# Patient Record
Sex: Male | Born: 1970 | Race: White | Hispanic: No | Marital: Single
Health system: Southern US, Community
[De-identification: ages and names within clinical notes are randomized; demographics above are authoritative.]

---

## 2002-12-17 ENCOUNTER — Emergency Department (HOSPITAL_COMMUNITY): Admission: EM | Admit: 2002-12-17 | Discharge: 2002-12-17 | Payer: Self-pay | Admitting: Emergency Medicine

## 2003-04-21 ENCOUNTER — Emergency Department (HOSPITAL_COMMUNITY): Admission: EM | Admit: 2003-04-21 | Discharge: 2003-04-22 | Payer: Self-pay | Admitting: Emergency Medicine

## 2009-06-09 ENCOUNTER — Emergency Department (HOSPITAL_BASED_OUTPATIENT_CLINIC_OR_DEPARTMENT_OTHER): Admission: EM | Admit: 2009-06-09 | Discharge: 2009-06-09 | Payer: Self-pay | Admitting: Emergency Medicine

## 2009-06-09 ENCOUNTER — Ambulatory Visit: Payer: Self-pay | Admitting: Diagnostic Radiology

## 2010-05-27 LAB — BASIC METABOLIC PANEL
BUN: 19 mg/dL (ref 6–23)
Chloride: 103 mEq/L (ref 96–112)
GFR calc non Af Amer: >60 mL/min (ref >60)
Glucose, Bld: 93 mg/dL (ref 70–99)

## 2010-05-27 LAB — DIFFERENTIAL
Basophils Absolute: 0.1 10*3/uL (ref 0.0–0.1)
Basophils Relative: 1 % (ref 0–1)
Eosinophils Absolute: 0.2 10*3/uL (ref 0.0–0.7)
Eosinophils Relative: 2 % (ref 0–5)
Lymphocytes Relative: 24 % (ref 12–46)
Lymphs Abs: 2.6 10*3/uL (ref 0.7–4.0)
Monocytes Absolute: 0.8 10*3/uL (ref 0.1–1.0)

## 2010-05-27 LAB — CBC
Hemoglobin: 15 g/dL (ref 13.0–17.0)
MCHC: 33.6 g/dL (ref 30.0–36.0)
WBC: 10.8 10*3/uL — ABNORMAL HIGH (ref 4.0–10.5)

## 2010-05-27 LAB — URINALYSIS, ROUTINE W REFLEX MICROSCOPIC
Glucose, UA: NEGATIVE mg/dL
Ketones, ur: NEGATIVE mg/dL
Urobilinogen, UA: 0.2 mg/dL (ref 0.0–1.0)

## 2012-09-12 ENCOUNTER — Other Ambulatory Visit (HOSPITAL_BASED_OUTPATIENT_CLINIC_OR_DEPARTMENT_OTHER): Payer: Self-pay | Admitting: Family Medicine

## 2012-09-12 DIAGNOSIS — M542 Cervicalgia: Secondary | ICD-10-CM

## 2012-09-12 DIAGNOSIS — M5412 Radiculopathy, cervical region: Secondary | ICD-10-CM

## 2012-09-16 ENCOUNTER — Ambulatory Visit (HOSPITAL_BASED_OUTPATIENT_CLINIC_OR_DEPARTMENT_OTHER)
Admission: RE | Admit: 2012-09-16 | Discharge: 2012-09-16 | Disposition: A | Discharge status: Home / Self Care | Payer: Managed Care, Other (non HMO) | Source: Ambulatory Visit | Attending: Family Medicine | Admitting: Family Medicine

## 2012-09-16 ENCOUNTER — Ambulatory Visit (HOSPITAL_BASED_OUTPATIENT_CLINIC_OR_DEPARTMENT_OTHER): Payer: Managed Care, Other (non HMO)

## 2012-09-16 DIAGNOSIS — M5412 Radiculopathy, cervical region: Secondary | ICD-10-CM

## 2012-09-16 DIAGNOSIS — M5020 Other cervical disc displacement, unspecified cervical region: Secondary | ICD-10-CM | POA: Insufficient documentation

## 2012-09-16 DIAGNOSIS — Q7649 Other congenital malformations of spine, not associated with scoliosis: Secondary | ICD-10-CM | POA: Insufficient documentation

## 2012-09-16 DIAGNOSIS — M542 Cervicalgia: Secondary | ICD-10-CM | POA: Insufficient documentation

## 2013-07-03 ENCOUNTER — Other Ambulatory Visit: Payer: Self-pay | Admitting: Family Medicine

## 2013-07-03 DIAGNOSIS — R0789 Other chest pain: Secondary | ICD-10-CM

## 2013-07-05 ENCOUNTER — Ambulatory Visit
Admission: RE | Admit: 2013-07-05 | Discharge: 2013-07-05 | Disposition: A | Discharge status: Home / Self Care | Payer: Managed Care, Other (non HMO) | Source: Ambulatory Visit | Attending: Family Medicine | Admitting: Family Medicine

## 2013-07-05 DIAGNOSIS — R0789 Other chest pain: Secondary | ICD-10-CM

## 2013-07-05 MED ORDER — IOHEXOL 300 MG/ML  SOLN
75.0000 mL | Freq: Once | INTRAMUSCULAR | Status: AC | PRN
  Administered 2013-07-05: 75 mL via INTRAVENOUS

## 2016-01-28 IMAGING — CT CT CHEST W/ CM
2 of 3 series · 15 of 36 positions shown, 18 images · IV contrast (75CC OMNI 300)
Comparison: CT chest scan of 06/09/2009

CLINICAL DATA: Chest pain in the left upper chest for 1 month

EXAM:
CT CHEST WITH CONTRAST
TECHNIQUE: Multidetector CT imaging of the chest was performed during
intravenous contrast administration.
CONTRAST:  75mL OMNIPAQUE IOHEXOL 300 MG/ML  SOLN

[Series 2: chest with · axial · 0.78mm/px · z∈[-267,-22]mm · 12 of 59 slices shown, 15 images]
[im 5/59  mediastinal]
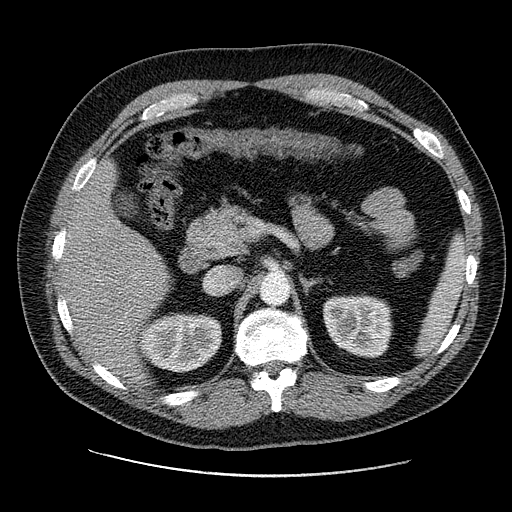
[im 5/59  lung]
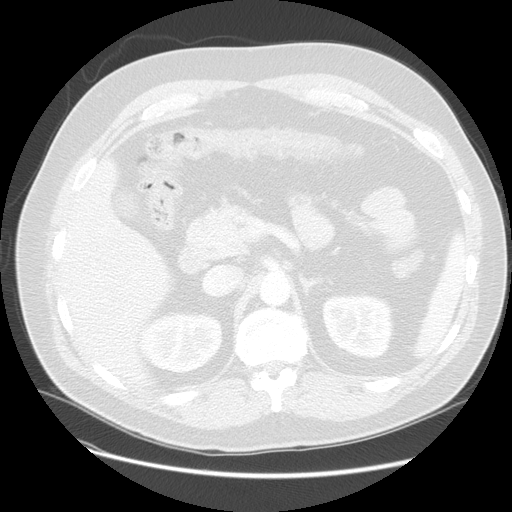
[im 9/59  lung]
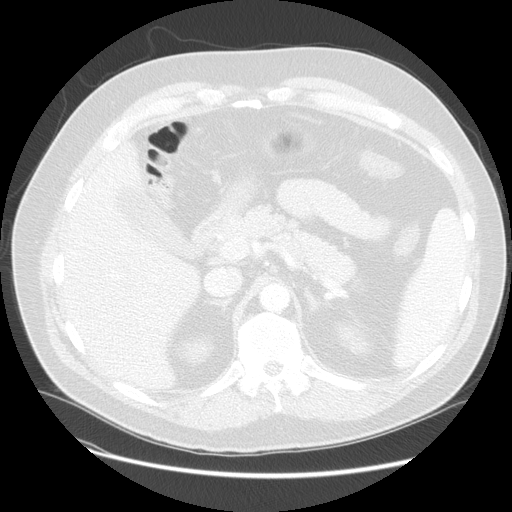
[im 13/59  lung]
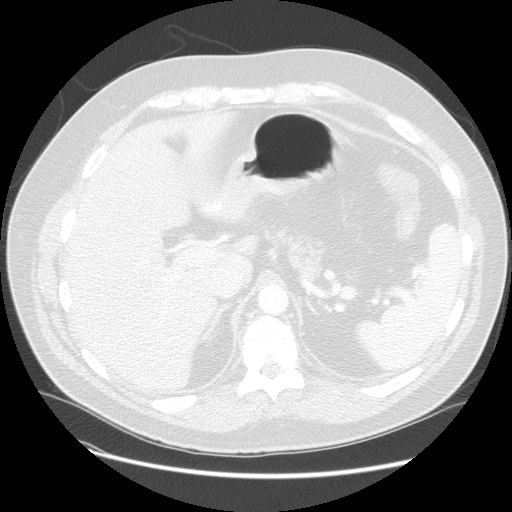
[im 18/59  lung]
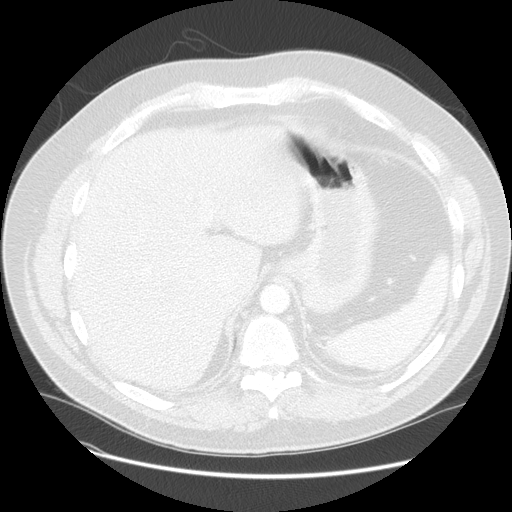
[im 22/59  mediastinal]
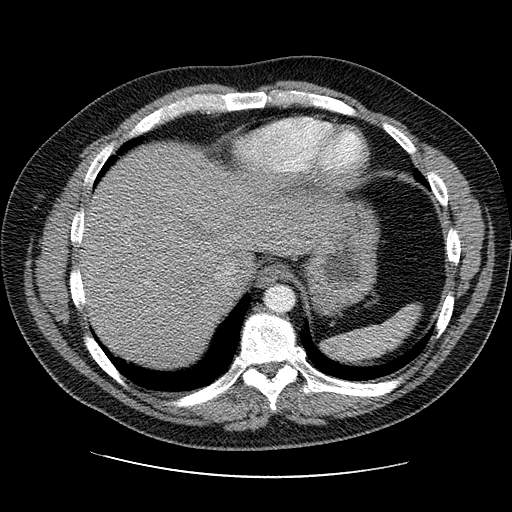
[im 22/59  lung]
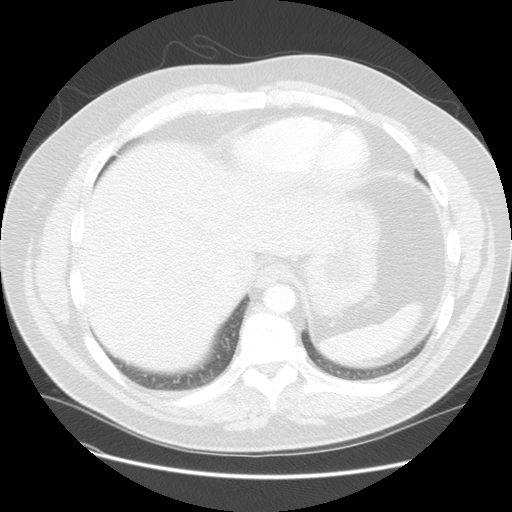
[im 26/59  lung]
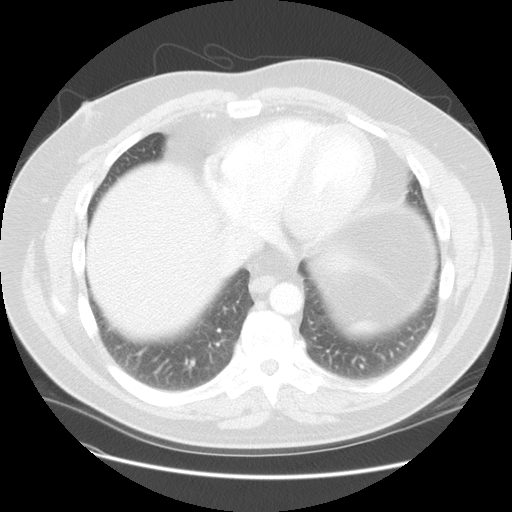
[im 33/59  lung]
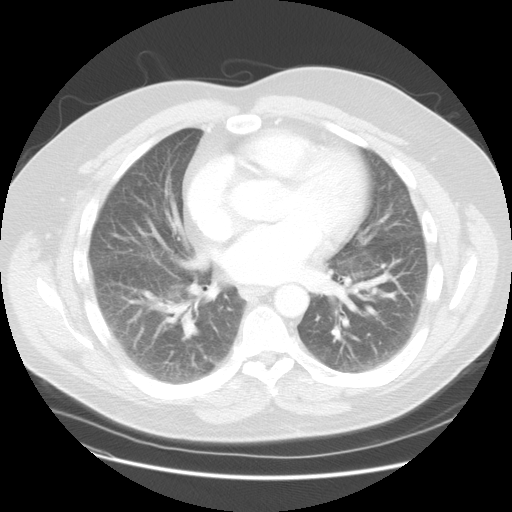
[im 37/59  lung]
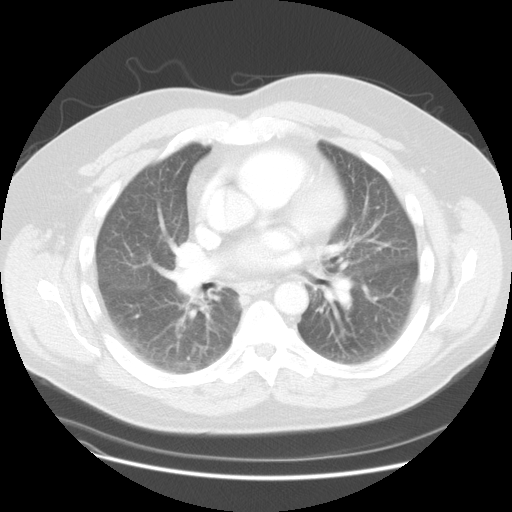
[im 41/59  mediastinal]
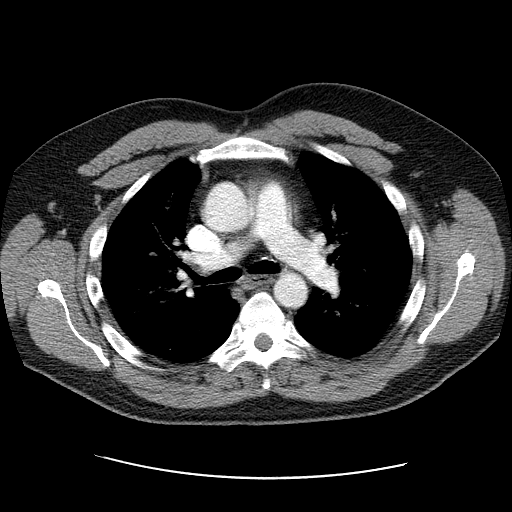
[im 41/59  lung]
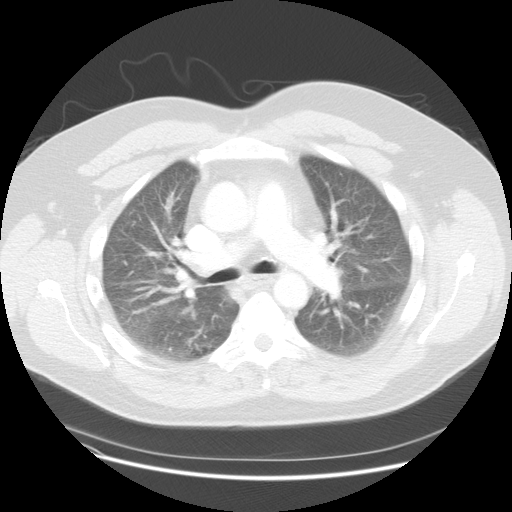
[im 46/59  lung]
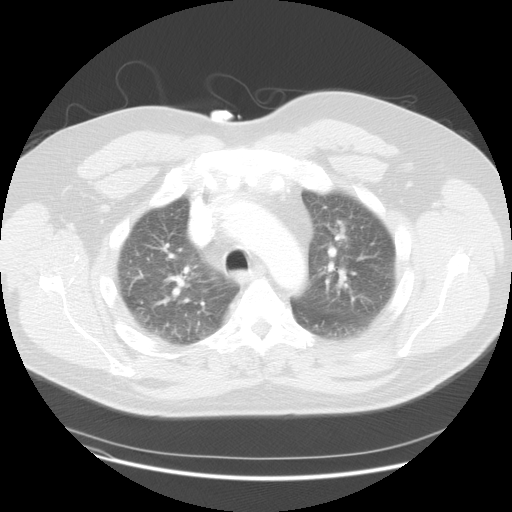
[im 50/59  lung]
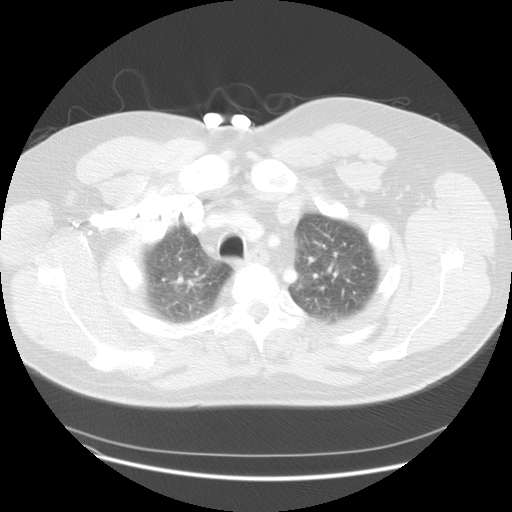
[im 54/59  lung]
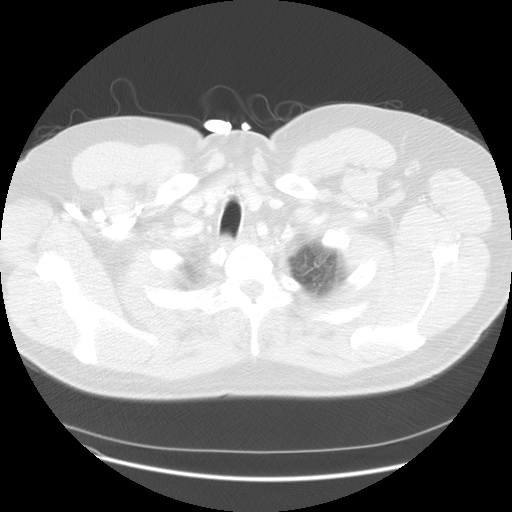

[Series 400: cor · coronal · 0.78mm/px · 3 of 157 slices shown]
[im 32/157  lung]
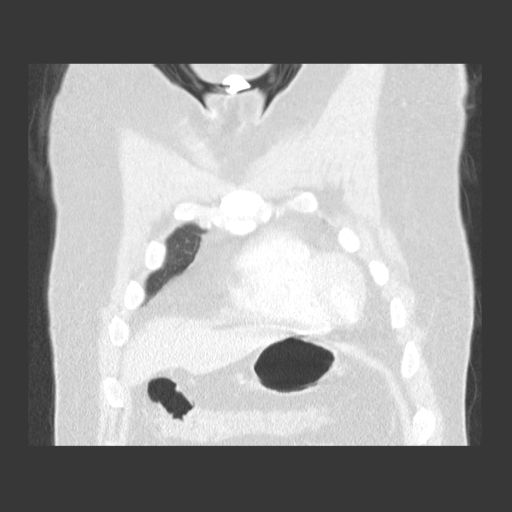
[im 63/157  lung]
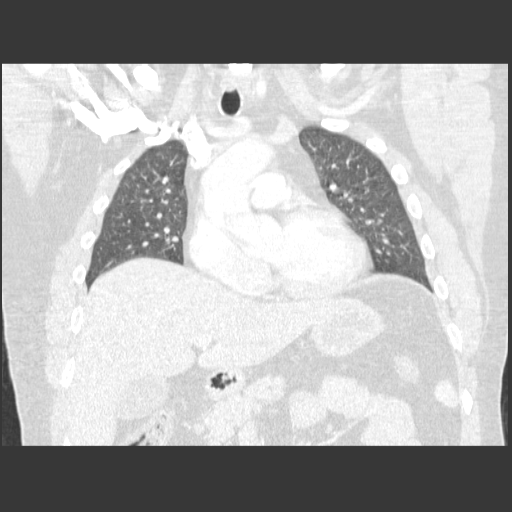
[im 94/157  lung]
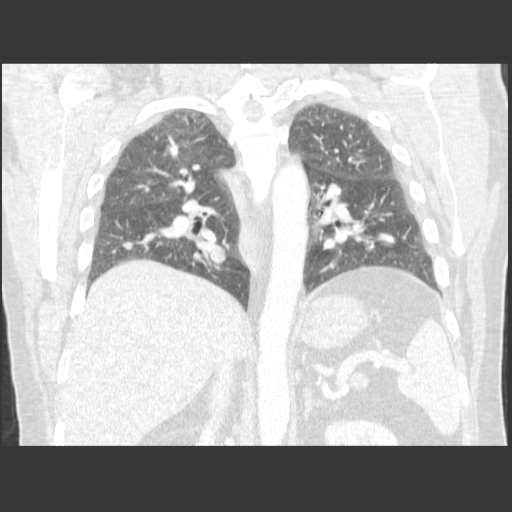

[15 of 36 positions shown; findings below may reference images not displayed]

FINDINGS: On lung window images, no lung infiltrate or nodule is seen. No
pleural effusion is noted. The central airway is patent. A small
bleb is noted peripherally in the posterior right lower lobe. On
bone window images the thoracic vertebrae are in normal alignment.
No rib abnormality is evident.

On soft tissue window images, the thyroid gland is unremarkable. The
thoracic aorta opacifies, and the origins of the great vessels are
patent. The pulmonary arteries opacify with no significant
abnormality noted. No mediastinal or hilar adenopathy is seen. The
upper abdomen is unremarkable with small splenules noted medially
and posteriorly in the left upper quadrant.
IMPRESSION: Negative CT of the chest. No lung infiltrate or nodule is seen and
no adenopathy is noted. No bony abnormality is noted.

## 2018-02-28 DIAGNOSIS — J069 Acute upper respiratory infection, unspecified: Secondary | ICD-10-CM | POA: Diagnosis not present

## 2018-06-11 DIAGNOSIS — L03211 Cellulitis of face: Secondary | ICD-10-CM | POA: Diagnosis not present

## 2018-08-16 DIAGNOSIS — R42 Dizziness and giddiness: Secondary | ICD-10-CM | POA: Diagnosis not present

## 2018-08-22 ENCOUNTER — Encounter (HOSPITAL_COMMUNITY): Payer: Self-pay | Admitting: Physician Assistant

## 2018-08-22 ENCOUNTER — Other Ambulatory Visit (HOSPITAL_COMMUNITY): Payer: Self-pay | Admitting: Physician Assistant

## 2018-08-22 DIAGNOSIS — R42 Dizziness and giddiness: Secondary | ICD-10-CM

## 2018-09-14 ENCOUNTER — Other Ambulatory Visit: Payer: Self-pay

## 2018-09-14 ENCOUNTER — Ambulatory Visit (HOSPITAL_COMMUNITY): Payer: BC Managed Care – PPO | Attending: Cardiovascular Disease

## 2018-09-14 ENCOUNTER — Encounter (INDEPENDENT_AMBULATORY_CARE_PROVIDER_SITE_OTHER): Payer: Self-pay

## 2018-09-14 DIAGNOSIS — R42 Dizziness and giddiness: Secondary | ICD-10-CM | POA: Diagnosis not present

## 2019-01-18 DIAGNOSIS — Z20828 Contact with and (suspected) exposure to other viral communicable diseases: Secondary | ICD-10-CM | POA: Diagnosis not present

## 2019-01-18 DIAGNOSIS — J069 Acute upper respiratory infection, unspecified: Secondary | ICD-10-CM | POA: Diagnosis not present

## 2019-01-19 DIAGNOSIS — R222 Localized swelling, mass and lump, trunk: Secondary | ICD-10-CM | POA: Diagnosis not present

## 2019-01-19 DIAGNOSIS — J029 Acute pharyngitis, unspecified: Secondary | ICD-10-CM | POA: Diagnosis not present

## 2019-06-12 DIAGNOSIS — D125 Benign neoplasm of sigmoid colon: Secondary | ICD-10-CM | POA: Diagnosis not present

## 2019-06-12 DIAGNOSIS — K648 Other hemorrhoids: Secondary | ICD-10-CM | POA: Diagnosis not present

## 2019-06-12 DIAGNOSIS — K621 Rectal polyp: Secondary | ICD-10-CM | POA: Diagnosis not present

## 2019-06-12 DIAGNOSIS — K635 Polyp of colon: Secondary | ICD-10-CM | POA: Diagnosis not present

## 2019-06-12 DIAGNOSIS — K6389 Other specified diseases of intestine: Secondary | ICD-10-CM | POA: Diagnosis not present

## 2019-06-12 DIAGNOSIS — Z1211 Encounter for screening for malignant neoplasm of colon: Secondary | ICD-10-CM | POA: Diagnosis not present

## 2020-12-26 DIAGNOSIS — M25521 Pain in right elbow: Secondary | ICD-10-CM | POA: Diagnosis not present

## 2020-12-26 DIAGNOSIS — M654 Radial styloid tenosynovitis [de Quervain]: Secondary | ICD-10-CM | POA: Diagnosis not present

## 2021-02-22 DIAGNOSIS — H109 Unspecified conjunctivitis: Secondary | ICD-10-CM | POA: Diagnosis not present

## 2021-02-27 DIAGNOSIS — H20012 Primary iridocyclitis, left eye: Secondary | ICD-10-CM | POA: Diagnosis not present

## 2021-03-03 DIAGNOSIS — H20012 Primary iridocyclitis, left eye: Secondary | ICD-10-CM | POA: Diagnosis not present

## 2021-04-08 DIAGNOSIS — L02818 Cutaneous abscess of other sites: Secondary | ICD-10-CM | POA: Diagnosis not present

## 2021-04-10 DIAGNOSIS — L02214 Cutaneous abscess of groin: Secondary | ICD-10-CM | POA: Diagnosis not present

## 2021-04-21 DIAGNOSIS — U071 COVID-19: Secondary | ICD-10-CM | POA: Diagnosis not present

## 2021-05-05 ENCOUNTER — Emergency Department (HOSPITAL_COMMUNITY): Payer: BC Managed Care – PPO

## 2021-05-05 ENCOUNTER — Emergency Department (HOSPITAL_COMMUNITY)
Admission: EM | Admit: 2021-05-05 | Discharge: 2021-05-05 | Disposition: A | Discharge status: Home / Self Care | Payer: BC Managed Care – PPO | Attending: Emergency Medicine | Admitting: Emergency Medicine

## 2021-05-05 ENCOUNTER — Other Ambulatory Visit: Payer: Self-pay

## 2021-05-05 DIAGNOSIS — N50812 Left testicular pain: Secondary | ICD-10-CM | POA: Diagnosis not present

## 2021-05-05 DIAGNOSIS — Z87891 Personal history of nicotine dependence: Secondary | ICD-10-CM | POA: Diagnosis not present

## 2021-05-05 DIAGNOSIS — N503 Cyst of epididymis: Secondary | ICD-10-CM | POA: Diagnosis not present

## 2021-05-05 MED ORDER — OXYCODONE HCL 5 MG PO TABS
5.0000 mg | ORAL_TABLET | Freq: Once | ORAL | Status: AC
  Administered 2021-05-05: 5 mg via ORAL
  Filled 2021-05-05: qty 1

## 2021-05-05 MED ORDER — ACETAMINOPHEN 500 MG PO TABS
1000.0000 mg | ORAL_TABLET | Freq: Once | ORAL | Status: AC
  Administered 2021-05-05: 1000 mg via ORAL
  Filled 2021-05-05: qty 2

## 2021-05-05 NOTE — Discharge Instructions (Signed)
Wear tight fitting underwear.  Follow-up with urologist in the office.  Try not to do any heavy lifting squatting or bending.  Take 4 over the counter ibuprofen tablets 3 times a day or 2 over-the-counter naproxen tablets twice a day for pain. Also take tylenol 1000mg (2 extra strength) four times a day.

## 2021-05-05 NOTE — ED Provider Notes (Signed)
[facility]-EMERGENCY DEPT Provider Note   CSN: [id number] Arrival date & time: 05/05/21  1949     History  Chief Complaint  Patient presents with   Testicle Pain    Dustin Craig is a 51 y.o. male.  51 yo M with a chief complaint of left testicular pain.  This started very shortly after having a sexual encounter with his wife.  He was standing to check on some meat he was smoking and started having progressive worsening pain to the testicle.  He felt [place] felt like he had an extra lump there and so came to the ED for evaluation.  Denies history of hernias.  Denies trauma to the area.   Testicle Pain      Home Medications Prior to Admission medications   Not on File      Allergies    Patient has no allergy information on record.    Review of Systems   Review of Systems  Genitourinary:  Positive for testicular pain.   Physical Exam Updated Vital Signs BP 127/74    Pulse 65    Temp 97.8 F (36.6 C) (Oral)    Resp 18    Ht 6\' 5"  (1.956 m)    Wt 122.5 kg    SpO2 95%    BMI 32.02 kg/m  Physical Exam Vitals and nursing note reviewed.  Constitutional:      Appearance: He is well-developed.  HENT:     Head: Normocephalic and atraumatic.  Eyes:     Pupils: Pupils are equal, round, and reactive to light.  Neck:     Vascular: No JVD.  Cardiovascular:     Rate and Rhythm: Normal rate and regular rhythm.     Heart sounds: No murmur heard.   No friction rub. No gallop.  Pulmonary:     Effort: No respiratory distress.     Breath sounds: No wheezing.  Abdominal:     General: There is no distension.     Tenderness: There is no abdominal tenderness. There is no guarding or rebound.  Genitourinary:    Comments: Circumcised.  No obvious abnormality to the right testicle.  Cremasteric reflex intact bilaterally.  No pain about the left testicle.  Separate mass proximal to the testicle. Musculoskeletal:        General: Normal range of motion.      Cervical back: Normal range of motion and neck supple.  Skin:    Coloration: Skin is not pale.     Findings: No rash.  Neurological:     Mental Status: He is alert and oriented to person, place, and time.  Psychiatric:        Behavior: Behavior normal.    ED Results / Procedures / Treatments   Labs (all labs ordered are listed, but only abnormal results are displayed) Labs Reviewed - No data to display  EKG None  Radiology SCROTUM W/DOPPLER  Result Date: 05/05/2021 CLINICAL DATA:  Left testicular pain EXAM: SCROTAL ULTRASOUND DOPPLER ULTRASOUND OF THE TESTICLES TECHNIQUE: Complete ultrasound examination of the testicles, epididymis, and other scrotal structures was performed. Color and spectral Doppler ultrasound were also utilized to evaluate blood flow to the testicles. COMPARISON:  None. FINDINGS: Right testicle Measurements: 3.0 x 4.2 x 2.5 cm. No mass or microlithiasis visualized. Left testicle Measurements: 4.7 x 2.1 x 3.4 cm. No mass or microlithiasis visualized. Right epididymis: 4 mm epididymal cyst of doubtful clinical significance. Otherwise normal size and appearance. Left epididymis: 10 mm  epididymal cyst of doubtful clinical significance. Otherwise normal size and appearance. Hydrocele:  None visualized. Varicocele:  None visualized. Pulsed Doppler interrogation of both testes demonstrates normal low resistance arterial and venous waveforms bilaterally. IMPRESSION: 1. Small bilateral epididymal cysts of doubtful clinical significance. 2. Unremarkable appearance of the testicles. Electronically Signed   By: Sharlet Salina M.D.   On: 05/05/2021 21:16    Procedures Procedures    Medications Ordered in ED Medications  acetaminophen (TYLENOL) tablet 1,000 mg (1,000 mg Oral Given 05/05/21 2058)  oxyCODONE (Oxy IR/ROXICODONE) immediate release tablet 5 mg (5 mg Oral Given 05/05/21 2058)    ED Course/ Medical Decision Making/ A&P                           Medical Decision  Making Amount and/or Complexity of Data Reviewed Radiology: ordered. ECG/medicine tests: ordered.  Risk OTC drugs. Prescription drug management.   51 yo M with a chief complaint of left testicular pain.  This been going on for couple hours now.  We will obtain a testicular ultrasound.  Ultrasound with bilateral epididymal cysts.  Left greater than right.  No signs of hernia.  Patient feeling mildly better on reassessment.  Discussed results.  We will have him follow-up with urology.  9:37 PM:  I have discussed the diagnosis/risks/treatment options with the patient and family.  Evaluation and diagnostic testing in the emergency department does not suggest an emergent condition requiring admission or immediate intervention beyond what has been performed at this time.  They will follow up with  Urology. We also discussed returning to the ED immediately if new or worsening sx occur. We discussed the sx which are most concerning (e.g., sudden worsening pain, fever, inability to tolerate by mouth) that necessitate immediate return. Medications administered to the patient during their visit and any new prescriptions provided to the patient are listed below.  Medications given during this visit Medications  acetaminophen (TYLENOL) tablet 1,000 mg (1,000 mg Oral Given 05/05/21 2058)  oxyCODONE (Oxy IR/ROXICODONE) immediate release tablet 5 mg (5 mg Oral Given 05/05/21 2058)     The patient appears reasonably screen and/or stabilized for discharge and I doubt any other medical condition or other [facility] requiring further screening, evaluation, or treatment in the ED at this time prior to discharge.          Final Clinical Impression(s) / ED Diagnoses Final diagnoses:  Pain in left testicle    Rx / DC Orders ED Discharge Orders     None         Melene Plan, DO 05/05/21 2137

## 2021-05-05 NOTE — ED Triage Notes (Signed)
Pt reports with left testicle pain since 6 pm today after he had sex. Pt states that the pain extends into his lower abdomen and makes him nauseous.

## 2021-05-05 NOTE — ED Notes (Signed)
US at bedside

## 2021-06-09 DIAGNOSIS — M542 Cervicalgia: Secondary | ICD-10-CM | POA: Diagnosis not present

## 2021-06-09 DIAGNOSIS — M654 Radial styloid tenosynovitis [de Quervain]: Secondary | ICD-10-CM | POA: Diagnosis not present

## 2021-06-18 DIAGNOSIS — M542 Cervicalgia: Secondary | ICD-10-CM | POA: Diagnosis not present

## 2021-07-01 DIAGNOSIS — M13832 Other specified arthritis, left wrist: Secondary | ICD-10-CM | POA: Diagnosis not present

## 2021-07-01 DIAGNOSIS — M13831 Other specified arthritis, right wrist: Secondary | ICD-10-CM | POA: Diagnosis not present

## 2021-07-01 DIAGNOSIS — M25532 Pain in left wrist: Secondary | ICD-10-CM | POA: Diagnosis not present

## 2021-07-01 DIAGNOSIS — M25531 Pain in right wrist: Secondary | ICD-10-CM | POA: Diagnosis not present

## 2021-08-18 DIAGNOSIS — I776 Arteritis, unspecified: Secondary | ICD-10-CM | POA: Diagnosis not present

## 2021-09-28 DIAGNOSIS — M310 Hypersensitivity angiitis: Secondary | ICD-10-CM | POA: Diagnosis not present

## 2021-10-28 DIAGNOSIS — M310 Hypersensitivity angiitis: Secondary | ICD-10-CM | POA: Diagnosis not present

## 2022-02-03 DIAGNOSIS — J069 Acute upper respiratory infection, unspecified: Secondary | ICD-10-CM | POA: Diagnosis not present

## 2022-04-02 DIAGNOSIS — M25511 Pain in right shoulder: Secondary | ICD-10-CM | POA: Diagnosis not present

## 2022-04-02 DIAGNOSIS — M25512 Pain in left shoulder: Secondary | ICD-10-CM | POA: Diagnosis not present

## 2022-04-12 DIAGNOSIS — J019 Acute sinusitis, unspecified: Secondary | ICD-10-CM | POA: Diagnosis not present

## 2022-05-14 DIAGNOSIS — M25511 Pain in right shoulder: Secondary | ICD-10-CM | POA: Diagnosis not present

## 2022-10-22 DIAGNOSIS — M722 Plantar fascial fibromatosis: Secondary | ICD-10-CM | POA: Diagnosis not present

## 2022-12-01 DIAGNOSIS — Z03818 Encounter for observation for suspected exposure to other biological agents ruled out: Secondary | ICD-10-CM | POA: Diagnosis not present

## 2022-12-01 DIAGNOSIS — R5383 Other fatigue: Secondary | ICD-10-CM | POA: Diagnosis not present

## 2022-12-01 DIAGNOSIS — R0981 Nasal congestion: Secondary | ICD-10-CM | POA: Diagnosis not present

## 2022-12-01 DIAGNOSIS — R519 Headache, unspecified: Secondary | ICD-10-CM | POA: Diagnosis not present

## 2022-12-14 DIAGNOSIS — M722 Plantar fascial fibromatosis: Secondary | ICD-10-CM | POA: Diagnosis not present

## 2022-12-22 DIAGNOSIS — J0100 Acute maxillary sinusitis, unspecified: Secondary | ICD-10-CM | POA: Diagnosis not present

## 2023-03-27 DIAGNOSIS — R110 Nausea: Secondary | ICD-10-CM | POA: Diagnosis not present

## 2023-03-27 DIAGNOSIS — Z03818 Encounter for observation for suspected exposure to other biological agents ruled out: Secondary | ICD-10-CM | POA: Diagnosis not present

## 2023-03-27 DIAGNOSIS — J020 Streptococcal pharyngitis: Secondary | ICD-10-CM | POA: Diagnosis not present

## 2023-03-30 DIAGNOSIS — J020 Streptococcal pharyngitis: Secondary | ICD-10-CM | POA: Diagnosis not present

## 2023-03-30 DIAGNOSIS — R52 Pain, unspecified: Secondary | ICD-10-CM | POA: Diagnosis not present

## 2023-03-30 DIAGNOSIS — Z20828 Contact with and (suspected) exposure to other viral communicable diseases: Secondary | ICD-10-CM | POA: Diagnosis not present

## 2023-05-25 DIAGNOSIS — M25531 Pain in right wrist: Secondary | ICD-10-CM | POA: Diagnosis not present

## 2023-10-11 DIAGNOSIS — G8929 Other chronic pain: Secondary | ICD-10-CM | POA: Diagnosis not present

## 2023-10-11 DIAGNOSIS — M546 Pain in thoracic spine: Secondary | ICD-10-CM | POA: Diagnosis not present

## 2023-10-11 DIAGNOSIS — Z6830 Body mass index (BMI) 30.0-30.9, adult: Secondary | ICD-10-CM | POA: Diagnosis not present

## 2023-10-28 DIAGNOSIS — M546 Pain in thoracic spine: Secondary | ICD-10-CM | POA: Diagnosis not present

## 2023-10-28 DIAGNOSIS — M47814 Spondylosis without myelopathy or radiculopathy, thoracic region: Secondary | ICD-10-CM | POA: Diagnosis not present

## 2023-10-28 DIAGNOSIS — M549 Dorsalgia, unspecified: Secondary | ICD-10-CM | POA: Diagnosis not present

## 2023-11-28 IMAGING — US US SCROTUM W/ DOPPLER COMPLETE
1 series · 15 of 25 positions shown · non-contrast
Comparison: None.

CLINICAL DATA: Left testicular pain

EXAM:
SCROTAL ULTRASOUND
DOPPLER ULTRASOUND OF THE TESTICLES
TECHNIQUE: Complete ultrasound examination of the testicles, epididymis, and
other scrotal structures was performed. Color and spectral Doppler
ultrasound were also utilized to evaluate blood flow to the
testicles.

[Series 1: us art/ven flow abd pelv doppl mc & wl · 15 of 65 slices shown]
[im 1/65]
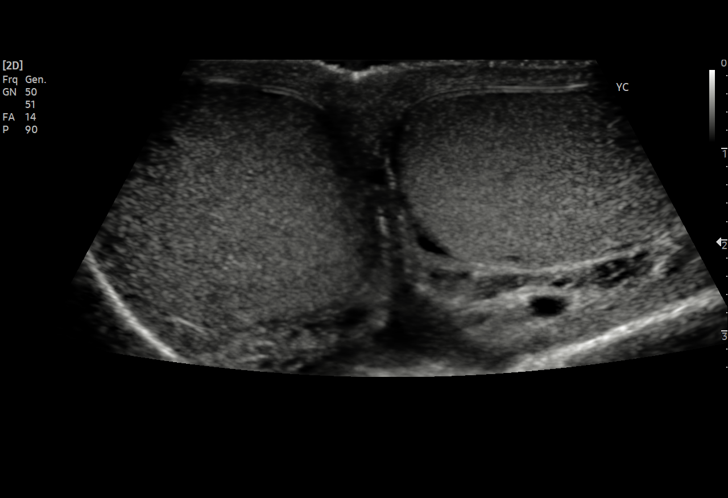
[im 6/65]
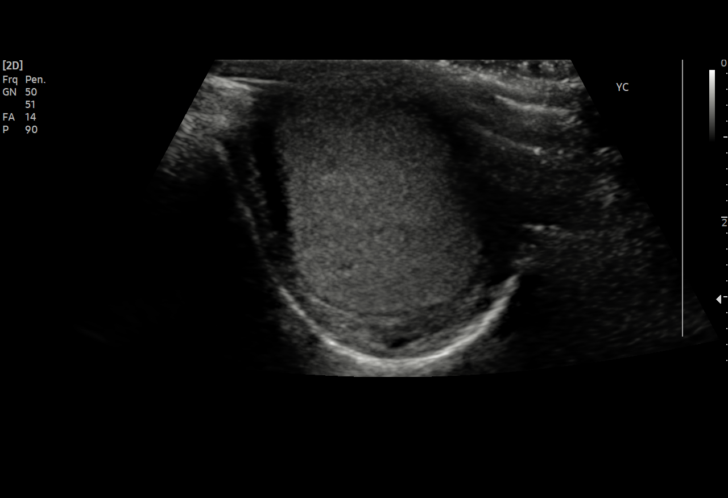
[im 11/65]
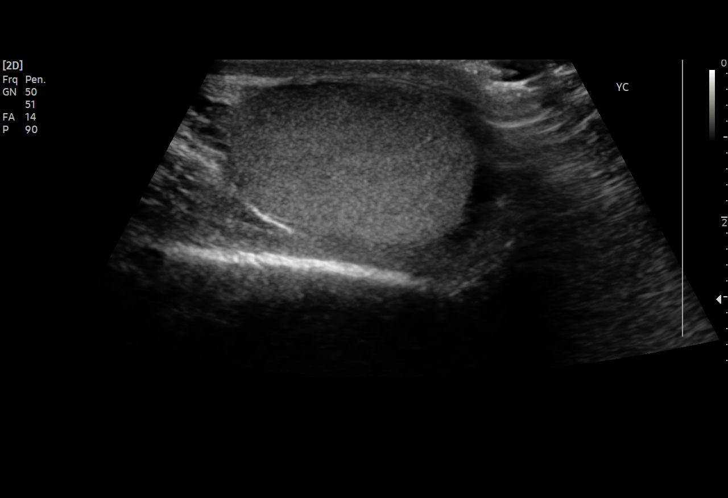
[im 14/65]
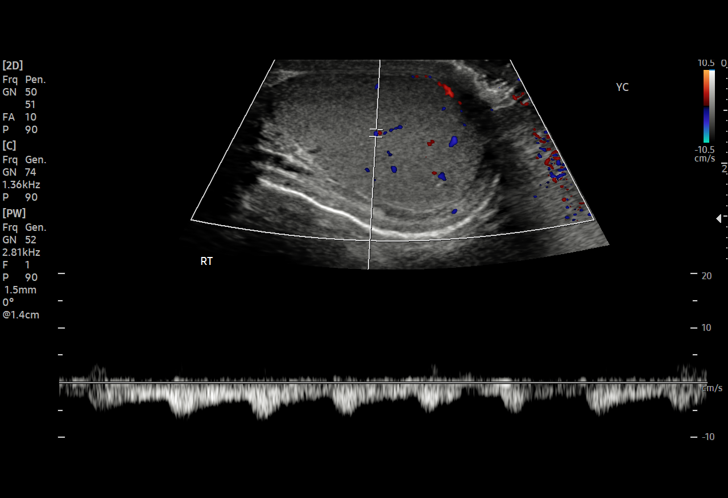
[im 19/65]
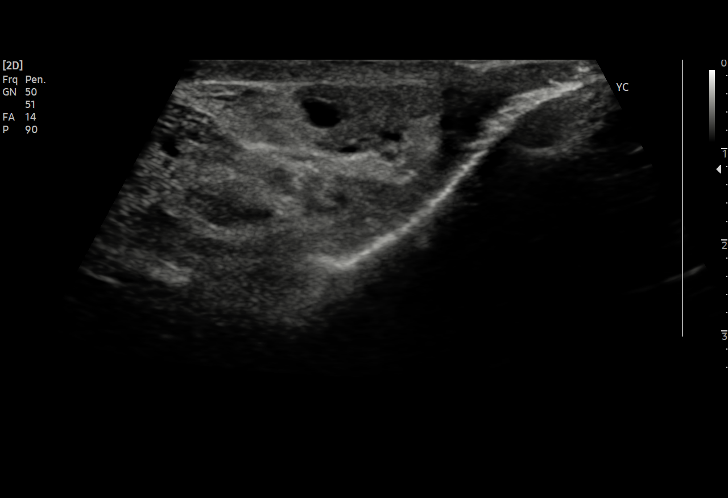
[im 25/65]
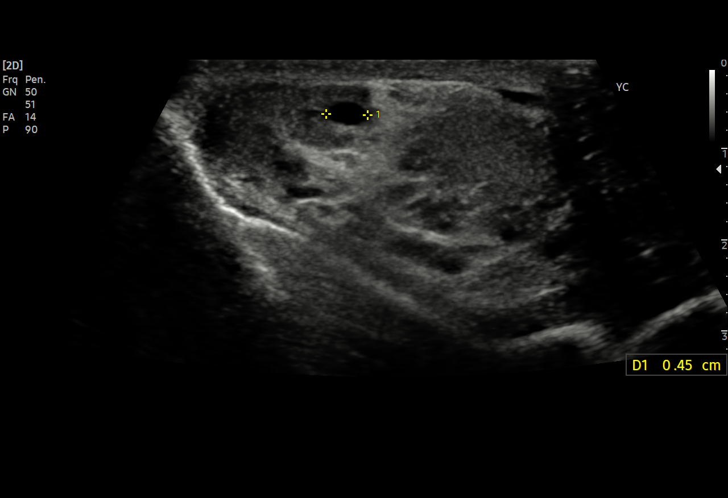
[im 27/65]
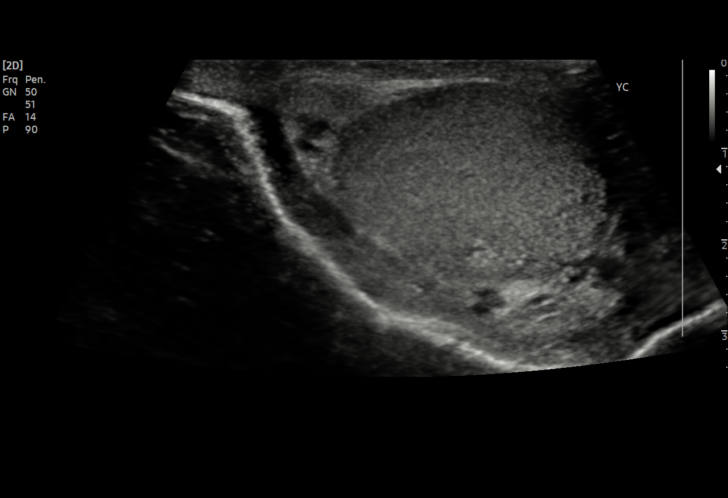
[im 33/65]
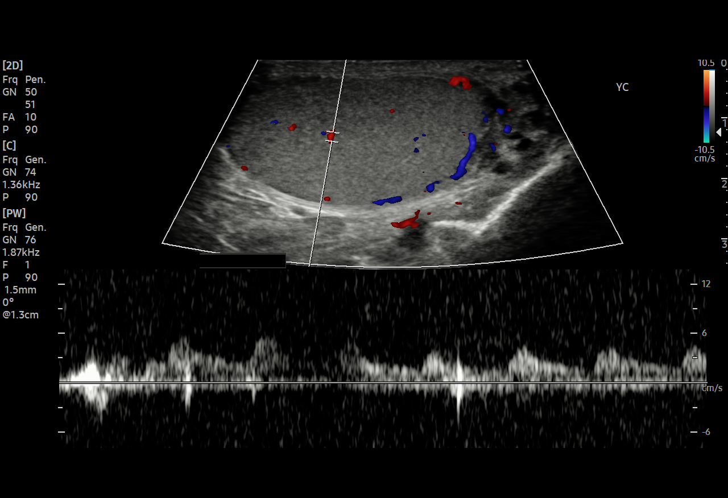
[im 38/65]
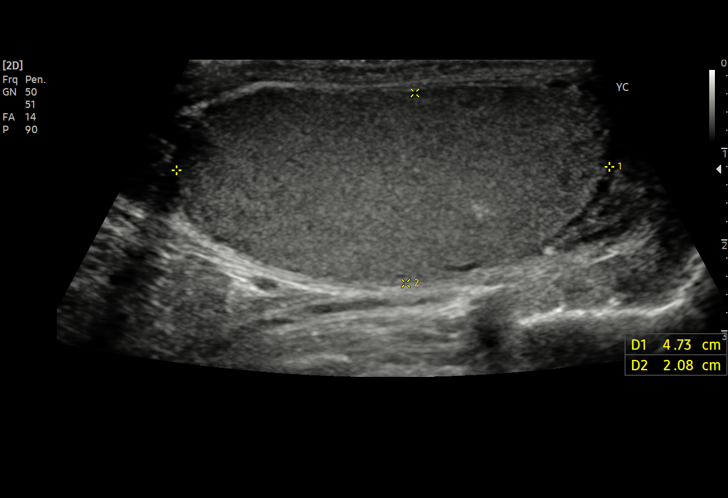
[im 41/65]
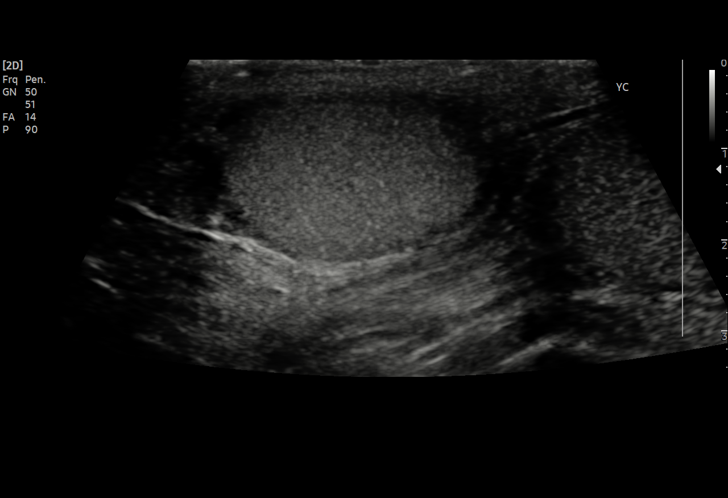
[im 46/65]
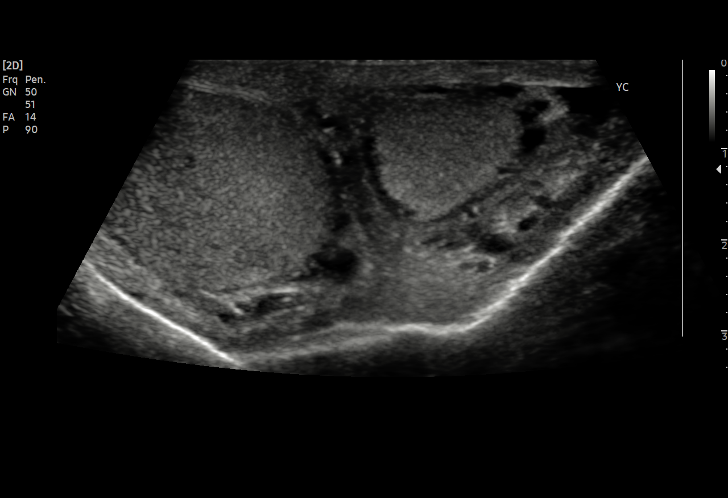
[im 51/65]
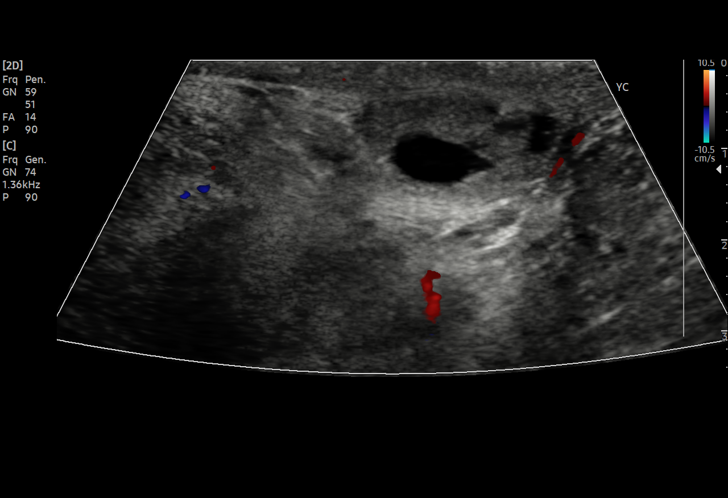
[im 54/65]
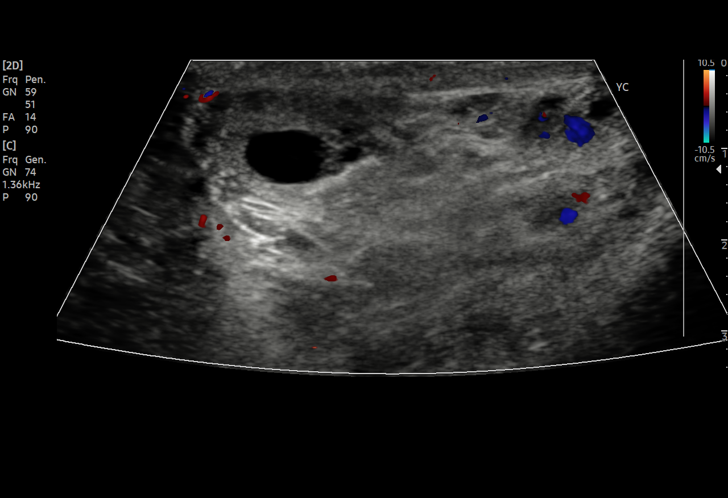
[im 59/65]
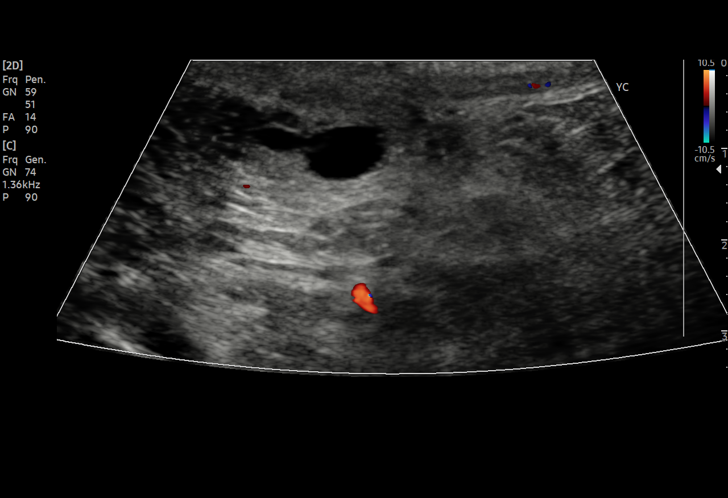
[im 65/65]
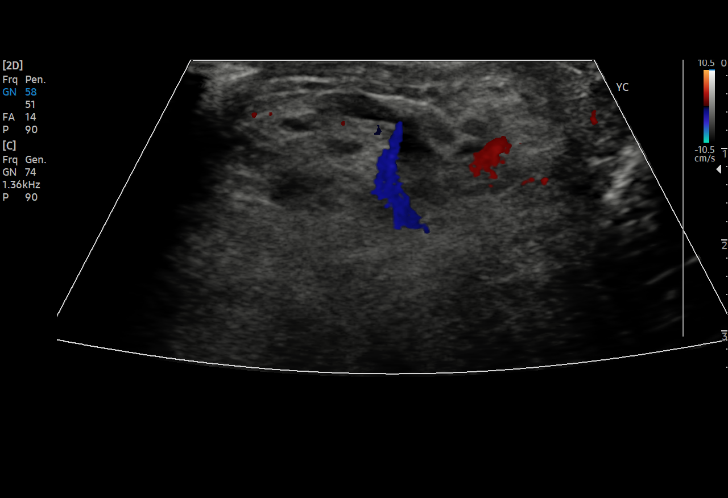

[15 of 25 positions shown; findings below may reference images not displayed]

FINDINGS: Right testicle

Measurements: 3.0 x 4.2 x 2.5 cm. No mass or microlithiasis
visualized.

Left testicle

Measurements: 4.7 x 2.1 x 3.4 cm. No mass or microlithiasis
visualized.

Right epididymis: 4 mm epididymal cyst of doubtful clinical
significance. Otherwise normal size and appearance.

Left epididymis: 10 mm epididymal cyst of doubtful clinical
significance. Otherwise normal size and appearance.

Hydrocele:  None visualized.

Varicocele:  None visualized.

Pulsed Doppler interrogation of both testes demonstrates normal low
resistance arterial and venous waveforms bilaterally.
IMPRESSION: 1. Small bilateral epididymal cysts of doubtful clinical
significance.
2. Unremarkable appearance of the testicles.

## 2024-02-08 DIAGNOSIS — Z0000 Encounter for general adult medical examination without abnormal findings: Secondary | ICD-10-CM | POA: Diagnosis not present

## 2024-02-08 DIAGNOSIS — Z131 Encounter for screening for diabetes mellitus: Secondary | ICD-10-CM | POA: Diagnosis not present

## 2024-02-08 DIAGNOSIS — Z125 Encounter for screening for malignant neoplasm of prostate: Secondary | ICD-10-CM | POA: Diagnosis not present

## 2024-02-08 DIAGNOSIS — Z13220 Encounter for screening for lipoid disorders: Secondary | ICD-10-CM | POA: Diagnosis not present
